# Patient Record
Sex: Male | Born: 1945 | Race: Black or African American | Hispanic: No | Marital: Married | Smoking: Current every day smoker
Health system: Southern US, Community
[De-identification: ages and names within clinical notes are randomized; demographics above are authoritative.]

## PROBLEM LIST (undated history)

## (undated) HISTORY — PX: OTHER SURGICAL HISTORY: SHX169

## (undated) HISTORY — PX: HERNIA REPAIR: SHX51

---

## 1998-10-24 ENCOUNTER — Emergency Department (HOSPITAL_COMMUNITY): Admission: EM | Admit: 1998-10-24 | Discharge: 1998-10-24 | Payer: Self-pay | Admitting: Emergency Medicine

## 1999-08-29 ENCOUNTER — Emergency Department (HOSPITAL_COMMUNITY): Admission: EM | Admit: 1999-08-29 | Discharge: 1999-08-29 | Payer: Self-pay | Admitting: Emergency Medicine

## 1999-09-05 ENCOUNTER — Emergency Department (HOSPITAL_COMMUNITY): Admission: EM | Admit: 1999-09-05 | Discharge: 1999-09-05 | Payer: Self-pay | Admitting: Emergency Medicine

## 2000-07-31 ENCOUNTER — Encounter: Payer: Self-pay | Admitting: Cardiology

## 2003-03-22 ENCOUNTER — Encounter: Admission: RE | Admit: 2003-03-22 | Discharge: 2003-03-22 | Payer: Self-pay | Admitting: Internal Medicine

## 2003-07-03 ENCOUNTER — Ambulatory Visit (HOSPITAL_COMMUNITY): Admission: RE | Admit: 2003-07-03 | Discharge: 2003-07-03 | Payer: Self-pay | Admitting: Gastroenterology

## 2004-03-29 ENCOUNTER — Ambulatory Visit (HOSPITAL_COMMUNITY): Admission: RE | Admit: 2004-03-29 | Discharge: 2004-03-29 | Payer: Self-pay | Admitting: General Surgery

## 2007-03-13 ENCOUNTER — Emergency Department (HOSPITAL_COMMUNITY): Admission: EM | Admit: 2007-03-13 | Discharge: 2007-03-13 | Payer: Self-pay | Admitting: Emergency Medicine

## 2007-11-30 ENCOUNTER — Encounter: Admission: RE | Admit: 2007-11-30 | Discharge: 2007-11-30 | Payer: Self-pay | Admitting: Cardiology

## 2010-09-06 NOTE — Op Note (Signed)
NAME:  Eddie Yang, Eddie Yang                 ACCOUNT NO.:  [id number]   MEDICAL RECORD NO.:  [id number]          PATIENT TYPE:  AMB   LOCATION:  DAY                          FACILITY:  [facility] ([facility])   PHYSICIAN:  Adolph Pollack, M.D.DATE OF BIRTH:  July 05, 1945   DATE OF PROCEDURE:  03/29/2004  DATE OF DISCHARGE:                                 OPERATIVE REPORT   PREOPERATIVE DIAGNOSIS:  Right inguinal hernia.   POSTOPERATIVE DIAGNOSIS:  Direct right inguinal hernia.   PROCEDURE:  Right inguinal hernia repair with mesh.   SURGEON:  Adolph Pollack, M.D.   ANESTHESIA:  General plus regional (0.5% plain Marcaine).   INDICATIONS:  This 65 year old male has noticed a swelling in the right  groin area.  On examination, he has an obvious right inguinal hernia and now  presents for repair.  The procedure and risks were discussed with him  preoperatively.   TECHNIQUE:  He is seen in the holding area, and the right groin marked with  my initials.  He is then brought to the operating room and placed supine on  the operating room table.  A general anesthetic was administered.  Hair from  the right groin was clipped, and the area was sterilely prepped and draped.  Local anesthetic was infiltrated in the superficial and deep tissues of the  right groin, and an incision was made through the skin, subcutaneous  tissues, Scarpa's fascia.  The external oblique aponeurosis was identified,  and local anesthetic infiltrated deep to it.  An incision was made in the  external oblique aponeurosis through the external ring medially and up  towards the anterior superior iliac spine laterally.  Using blunt  dissection, the internal oblique muscle and aponeurosis were exposed  superiorly, and the shelving edge of the inguinal ligament was exposed  inferiorly.  The ilioinguinal nerve was identified and dissected free from  the spermatic cord and anesthetized with the Marcaine solution.  The  spermatic cord was then  isolated.  The cord was inspected, and I did not see  an indirect sac present.  There was a direct hernia present.  I cleaned off  the posterior aspect of the spermatic cord and exposed the pubic tubercle  and the area medial to it.   Following this, a piece of 3 x 6 inch polypropylene mesh was brought into  the field and anchored 1 cm medial to the pubic tubercle with a 2-0 Prolene  suture.  The inferior aspect of the mesh was anchored to the shelving edge  of the inguinal ligament.  The running 2-0 Prolene suture up to this level 1  cm lateral to the internal ring.  Following this, a slit was cut in the mesh  and wrapped around the spermatic cord.  The superior aspect of the mesh was  then anchored to the internal oblique muscle and aponeurosis with  interrupted 2-0 Vicryl sutures.  The two tails of the mesh was then crossed,  creating a new internal ring, and these tails were then anchored to the  shelving edge of the inguinal ligament with a  single 2-0 Prolene suture.  The cord was mobile through the new aperture, and the tip of the hemostat  could be fit through it as well.   Following this, hemostasis was noted to be adequate.  I then closed the  external oblique aponeurosis over the mesh and then the cord with a running  3-0 Vicryl suture.  Scarpa's fascia is closed with a running 2-0 Vicryl  suture.  The skin is closed with a 4-0 Monocryl subcuticular stitch followed  by Steri-Strips and sterile dressing.   He tolerated the procedure well without any apparent complications and was  taken to the recovery room in satisfactory condition.     Tish Men  D:  03/29/2004  T:  03/29/2004  Job:  [id number]   cc:   Olene Craven, M.D.  [address]  [address]  [place]  [place] [address]  Fax: [phone]

## 2010-09-06 NOTE — Op Note (Signed)
NAME:  Eddie Yang, Eddie Yang                           ACCOUNT NO.:  [id number]   MEDICAL RECORD NO.:  [id number]                   PATIENT TYPE:  AMB   LOCATION:  ENDO                                 FACILITY:  [facility]   PHYSICIAN:  Anselmo Rod, M.D.               DATE OF BIRTH:  [birth year]   DATE OF PROCEDURE:  07/03/2003  DATE OF DISCHARGE:                                 OPERATIVE REPORT   PROCEDURE:  Screening colonoscopy.   ENDOSCOPIST:  Charna Elizabeth, M.D.   INSTRUMENT USED:  Olympus video colonoscope.   INDICATIONS FOR PROCEDURE:  65 year old African American male undergoing  screening colonoscopy to rule out colonic polyps, masses, etc.   PREPROCEDURE PREPARATION:  Informed consent was obtained from the patient.  The patient was fasted for eight hours prior to the procedure and prepped  with a bottle of magnesium citrate and a gallon of GoLYTELY the night prior  to the procedure.   PREPROCEDURE PHYSICAL:  Patient with stable vital signs.  Neck supple.  Chest clear to auscultation.  S1 and S2 regular.  Abdomen soft with normal  bowel sounds.   DESCRIPTION OF PROCEDURE:  The patient was placed in the left lateral  decubitus position, sedated with 75 mg of Demerol and 7.5 mg Versed  intravenously.  Once the patient was adequately sedated, maintained on low  flow oxygen, continuous cardiac monitoring, the Olympus video colonoscope  was advanced from the rectum to the cecum without difficulty.  The  appendiceal orifice and ileocecal valve were visualized and photographed.  No masses, polyps, erosions, ulcerations, or diverticula were seen.  Retroflexion of the rectum revealed no abnormalities.   IMPRESSION:  Normal colonoscopy to the cecum.   RECOMMENDATIONS:  1. Repeat CRC screening is recommended in the next ten years unless the     patient develops any abnormal symptoms in the interim.  2. Outpatient follow up as need arises in the future.                  Anselmo Rod, M.D.    JNM/MEDQ  D:  07/03/2003  T:  07/03/2003  Job:  [id number]   cc:   Olene Craven, M.D.  [address]  [address]  [place]  [place] [address]  Fax: [phone]

## 2012-06-17 ENCOUNTER — Ambulatory Visit: Payer: No Typology Code available for payment source | Admitting: Family Medicine

## 2012-06-17 VITALS — BP 128/82 | HR 69 | Temp 97.8°F | Resp 16 | Ht 69.0 in | Wt 165.4 lb

## 2012-06-17 DIAGNOSIS — J019 Acute sinusitis, unspecified: Secondary | ICD-10-CM

## 2012-06-17 DIAGNOSIS — R590 Localized enlarged lymph nodes: Secondary | ICD-10-CM

## 2012-06-17 DIAGNOSIS — R599 Enlarged lymph nodes, unspecified: Secondary | ICD-10-CM

## 2012-06-17 LAB — POCT CBC
Granulocyte percent: 62.6 % (ref 37–80)
HCT, POC: 45.6 % (ref 43.5–53.7)
Hemoglobin: 14.3 g/dL (ref 14.1–18.1)
Lymph, poc: 2.2 (ref 0.6–3.4)
MCH, POC: 26 pg — AB (ref 27–31.2)
MCHC: 31.4 g/dL — AB (ref 31.8–35.4)
MCV: 82.8 fL (ref 80–97)
MID (cbc): 0.5 (ref 0–0.9)
MPV: 9 fL (ref 0–99.8)
POC Granulocyte: 4.4 (ref 2–6.9)
POC LYMPH PERCENT: 30.8 % (ref 10–50)
POC MID %: 6.6 %M (ref 0–12)
Platelet Count, POC: 382 10*3/uL (ref 142–424)
RBC: 5.51 M/uL (ref 4.69–6.13)
RDW, POC: 14.3 %
WBC: 7.1 10*3/uL (ref 4.6–10.2)

## 2012-06-17 MED ORDER — AMOXICILLIN 875 MG PO TABS: 875.0000 mg | ORAL_TABLET | Freq: Two times a day (BID) | ORAL | Status: AC

## 2012-06-17 NOTE — Progress Notes (Signed)
[facility]:  Office Visit  Chief Complaint:  Chief Complaint  Patient presents with  . lump on right side of neck    HPI: Eddie Yang is a 67 y.o. male who complains of  1 week history of right side soreness, throat. Tender with touching. + chills, denies fevers. Wears dentures but recently theres is a soreness, Denies any unintentional night sweat, weight loss. Denies URI sxs. Took Alkaselzter plus and relieved his HA. No recent travels, medicines, sick contacts  History reviewed. No pertinent past medical history. Past Surgical History  Procedure Laterality Date  . Hernia repair    . Right rotator cuff     History   Social History  . Marital Status: Married    Spouse Name: N/A    Number of Children: N/A  . Years of Education: N/A   Social History Main Topics  . Smoking status: Current Every Day Smoker -- 1.00 packs/day for 40 years  . Smokeless tobacco: None  . Alcohol Use: No  . Drug Use: No  . Sexually Active: Yes   Other Topics Concern  . None   Social History Narrative  . None   Family History  Problem Relation Age of Onset  . Diabetes Father    No Known Allergies Prior to Admission medications   Not on File     ROS: The patient denies fevers, night sweats, unintentional weight loss, chest pain, palpitations, wheezing, dyspnea on exertion, nausea, vomiting, abdominal pain, dysuria, hematuria, melena, numbness, weakness, or tingling.   All other systems have been reviewed and were otherwise negative with the exception of those mentioned in the HPI and as above.    PHYSICAL EXAM: Filed Vitals:   06/17/12 1151  BP: 128/82  Pulse: 69  Temp: 97.8 F (36.6 C)  Resp: 16   Filed Vitals:   06/17/12 1151  Height: 5\' 9"  (1.753 m)  Weight: 165 lb 6.4 oz (75.025 kg)   Body mass index is 24.41 kg/(m^2).  General: Alert, no acute distress HEENT:  Normocephalic, atraumatic, oropharynx patent. TM nl. No exudates, no erythema. No  original teeth, no signs of infection in oral cavity, + sinus tenderness Cardiovascular:  Regular rate and rhythm, no rubs murmurs or gallops.  No Carotid bruits, radial pulse intact. No pedal edema.  Respiratory: Clear to auscultation bilaterally.  No wheezes, rales, or rhonchi.  No cyanosis, no use of accessory musculature GI: No organomegaly, abdomen is soft and non-tender, positive bowel sounds.  No masses. Skin: No rashes. Neurologic: Facial musculature symmetric. Psychiatric: Patient is appropriate throughout our interaction. Lymphatic: + 10 mm right anterior cervical lymphadenopathy, tender, mobile Musculoskeletal: Gait intact.   LABS: Results for orders placed in visit on 06/17/12  POCT CBC      Result Value Range   WBC 7.1  4.6 - 10.2 K/uL   Lymph, poc 2.2  0.6 - 3.4   POC LYMPH PERCENT 30.8  10 - 50 %L   MID (cbc) 0.5  0 - 0.9   POC MID % 6.6  0 - 12 %M   POC Granulocyte 4.4  2 - 6.9   Granulocyte percent 62.6  37 - 80 %G   RBC 5.51  4.69 - 6.13 M/uL   Hemoglobin 14.3  14.1 - 18.1 g/dL   HCT, POC 11.9  14.7 - 53.7 %   MCV 82.8  80 - 97 fL   MCH, POC 26.0 (*) 27 - 31.2 pg   MCHC 31.4 (*)  31.8 - 35.4 g/dL   RDW, POC 16.1     Platelet Count, POC 382  142 - 424 K/uL   MPV 9.0  0 - 99.8 fL     EKG/XRAY:   Primary read interpreted by Dr. Conley Rolls at [facility].   ASSESSMENT/PLAN: Encounter Diagnoses  Name Primary?  . Lymphadenopathy of right cervical region Yes  . Acute sinusitis     Rx Amox 875 mg BID  F/u in 3 weeks prn if LAD is not resolved    LE, THAO PHUONG, DO 06/19/2012 9:58 AM

## 2013-12-08 ENCOUNTER — Ambulatory Visit
Admission: RE | Admit: 2013-12-08 | Discharge: 2013-12-08 | Disposition: A | Discharge status: Home / Self Care | Payer: PRIVATE HEALTH INSURANCE | Source: Ambulatory Visit | Attending: Family Medicine | Admitting: Family Medicine

## 2013-12-08 ENCOUNTER — Other Ambulatory Visit: Payer: Self-pay | Admitting: Family Medicine

## 2013-12-08 DIAGNOSIS — R200 Anesthesia of skin: Secondary | ICD-10-CM

## 2013-12-08 DIAGNOSIS — R202 Paresthesia of skin: Secondary | ICD-10-CM

## 2013-12-08 DIAGNOSIS — F17200 Nicotine dependence, unspecified, uncomplicated: Secondary | ICD-10-CM

## 2013-12-20 ENCOUNTER — Other Ambulatory Visit: Payer: Self-pay | Admitting: Family Medicine

## 2013-12-20 DIAGNOSIS — R9389 Abnormal findings on diagnostic imaging of other specified body structures: Secondary | ICD-10-CM

## 2013-12-22 ENCOUNTER — Ambulatory Visit
Admission: RE | Admit: 2013-12-22 | Discharge: 2013-12-22 | Disposition: A | Discharge status: Home / Self Care | Payer: PRIVATE HEALTH INSURANCE | Source: Ambulatory Visit | Attending: Family Medicine | Admitting: Family Medicine

## 2013-12-22 DIAGNOSIS — R9389 Abnormal findings on diagnostic imaging of other specified body structures: Secondary | ICD-10-CM

## 2013-12-22 MED ORDER — IOHEXOL 300 MG/ML  SOLN
75.0000 mL | Freq: Once | INTRAMUSCULAR | Status: AC | PRN
  Administered 2013-12-22: 75 mL via INTRAVENOUS

## 2018-01-26 ENCOUNTER — Telehealth: Payer: Self-pay | Admitting: Acute Care

## 2018-01-26 DIAGNOSIS — Z122 Encounter for screening for malignant neoplasm of respiratory organs: Secondary | ICD-10-CM

## 2018-01-26 DIAGNOSIS — F17210 Nicotine dependence, cigarettes, uncomplicated: Principal | ICD-10-CM

## 2018-01-29 NOTE — Telephone Encounter (Signed)
Routing message to denise.

## 2018-02-02 NOTE — Telephone Encounter (Signed)
Spoke with pt and scheduled SDMV 02/10/18 9:30 CT ordered Nothing further needed

## 2018-02-10 ENCOUNTER — Encounter: Payer: Self-pay | Admitting: Acute Care

## 2018-02-10 ENCOUNTER — Ambulatory Visit (INDEPENDENT_AMBULATORY_CARE_PROVIDER_SITE_OTHER)
Admission: RE | Admit: 2018-02-10 | Discharge: 2018-02-10 | Disposition: A | Discharge status: Home / Self Care | Payer: No Typology Code available for payment source | Source: Ambulatory Visit | Attending: Acute Care | Admitting: Acute Care

## 2018-02-10 ENCOUNTER — Ambulatory Visit (INDEPENDENT_AMBULATORY_CARE_PROVIDER_SITE_OTHER): Payer: No Typology Code available for payment source | Admitting: Acute Care

## 2018-02-10 DIAGNOSIS — F17210 Nicotine dependence, cigarettes, uncomplicated: Secondary | ICD-10-CM

## 2018-02-10 DIAGNOSIS — Z122 Encounter for screening for malignant neoplasm of respiratory organs: Secondary | ICD-10-CM

## 2018-02-10 NOTE — Progress Notes (Signed)
Shared Decision Making Visit Lung Cancer Screening Program [phone])   Eligibility:  Age 72 y.o.  Pack Years Smoking History Calculation 40 pack year smoking history (# packs/per year x # years smoked)  Recent History of coughing up blood  no  Unexplained weight loss? no ( >Than 15 pounds within the last 6 months )  Prior History Lung / other cancer no (Diagnosis within the last 5 years already requiring surveillance chest CT Scans).  Smoking Status Current Smoker  Former Smokers: Years since quit: NA  Quit Date: NA  Visit Components:  Discussion included one or more decision making aids. yes  Discussion included risk/benefits of screening. yes  Discussion included potential follow up diagnostic testing for abnormal scans. yes  Discussion included meaning and risk of over diagnosis. yes  Discussion included meaning and risk of False Positives. yes  Discussion included meaning of total radiation exposure. yes  Counseling Included:  Importance of adherence to annual lung cancer LDCT screening. yes  Impact of comorbidities on ability to participate in the program. yes  Ability and willingness to under diagnostic treatment. yes  Smoking Cessation Counseling:  Current Smokers:   Discussed importance of smoking cessation. yes  Information about tobacco cessation classes and interventions provided to patient. yes  Patient provided with "ticket" for LDCT Scan. yes  Symptomatic Patient. no  Counseling  Diagnosis Code: Tobacco Use Z72.0  Asymptomatic Patient yes  Counseling (Intermediate counseling: > three minutes counseling) L2440  Former Smokers:   Discussed the importance of maintaining cigarette abstinence. yes  Diagnosis Code: Personal History of Nicotine Dependence. N02.725  Information about tobacco cessation classes and interventions provided to patient. Yes  Patient provided with "ticket" for LDCT Scan. yes  Written Order for Lung Cancer  Screening with LDCT placed in Epic. Yes (CT Chest Lung Cancer Screening Low Dose W/O CM) [id number] Z12.2-Screening of respiratory organs Z87.891-Personal history of nicotine dependence  I have spent 25 minutes of face to face time with Mr. Nack discussing the risks and benefits of lung cancer screening. We viewed a power point together that explained in detail the above noted topics. We paused at intervals to allow for questions to be asked and answered to ensure understanding.We discussed that the single most powerful action that he can take to decrease his risk of developing lung cancer is to quit smoking. We discussed whether or not he is ready to commit to setting a quit date. He is not ready to set a quit date.We discussed options for tools to aid in quitting smoking including nicotine replacement therapy, non-nicotine medications, support groups, Quit Smart classes, and behavior modification. We discussed that often times setting smaller, more achievable goals, such as eliminating 1 cigarette a day for a week and then 2 cigarettes a day for a week can be helpful in slowly decreasing the number of cigarettes smoked. This allows for a sense of accomplishment as well as providing a clinical benefit. I gave him the " Be Stronger Than Your Excuses" card with contact information for community resources, classes, free nicotine replacement therapy, and access to mobile apps, text messaging, and on-line smoking cessation help. I have also given him my card and contact information in the event he needs to contact me. We discussed the time and location of the scan, and that either Abigail Miyamoto RN or I will call with the results within 24-48 hours of receiving them. I have offered him  a copy of the power point we viewed  as a  resource in the event they need reinforcement of the concepts we discussed today in the office. The patient verbalized understanding of all of  the above and had no further questions upon  leaving the office. They have my contact information in the event they have any further questions.  I spent 3 minutes counseling on smoking cessation and the health risks of continued tobacco abuse.  I explained to the patient that there has been a high incidence of coronary artery disease noted on these exams. I explained that this is a non-gated exam therefore degree or severity cannot be determined. This patient is currently on statin therapy. I have asked the patient to follow-up with their PCP regarding any incidental finding of coronary artery disease and management with diet or medication as their PCP  feels is clinically indicated. The patient verbalized understanding of the above and had no further questions upon completion of the visit.      Bevelyn Ngo, NP 02/10/2018 9:55 AM

## 2018-02-11 ENCOUNTER — Other Ambulatory Visit: Payer: Self-pay | Admitting: Acute Care

## 2018-02-11 DIAGNOSIS — F17210 Nicotine dependence, cigarettes, uncomplicated: Principal | ICD-10-CM

## 2018-02-11 DIAGNOSIS — Z87891 Personal history of nicotine dependence: Secondary | ICD-10-CM

## 2018-02-11 DIAGNOSIS — Z122 Encounter for screening for malignant neoplasm of respiratory organs: Secondary | ICD-10-CM

## 2018-09-29 ENCOUNTER — Other Ambulatory Visit: Payer: Self-pay | Admitting: Pediatric Intensive Care

## 2018-09-29 DIAGNOSIS — Z20822 Contact with and (suspected) exposure to covid-19: Secondary | ICD-10-CM

## 2018-10-01 LAB — NOVEL CORONAVIRUS, NAA: SARS-CoV-2, NAA: NOT DETECTED

## 2019-03-01 ENCOUNTER — Other Ambulatory Visit: Payer: Self-pay

## 2019-03-01 ENCOUNTER — Ambulatory Visit (INDEPENDENT_AMBULATORY_CARE_PROVIDER_SITE_OTHER)
Admission: RE | Admit: 2019-03-01 | Discharge: 2019-03-01 | Disposition: A | Discharge status: Home / Self Care | Payer: No Typology Code available for payment source | Source: Ambulatory Visit | Attending: Acute Care | Admitting: Acute Care

## 2019-03-01 ENCOUNTER — Encounter (INDEPENDENT_AMBULATORY_CARE_PROVIDER_SITE_OTHER): Payer: Self-pay

## 2019-03-01 DIAGNOSIS — Z87891 Personal history of nicotine dependence: Secondary | ICD-10-CM | POA: Diagnosis not present

## 2019-03-01 DIAGNOSIS — Z122 Encounter for screening for malignant neoplasm of respiratory organs: Secondary | ICD-10-CM

## 2019-03-01 DIAGNOSIS — F17210 Nicotine dependence, cigarettes, uncomplicated: Secondary | ICD-10-CM

## 2019-03-09 ENCOUNTER — Telehealth: Payer: Self-pay | Admitting: Acute Care

## 2019-03-09 DIAGNOSIS — Z122 Encounter for screening for malignant neoplasm of respiratory organs: Secondary | ICD-10-CM

## 2019-03-09 DIAGNOSIS — Z87891 Personal history of nicotine dependence: Secondary | ICD-10-CM

## 2019-03-09 DIAGNOSIS — F17210 Nicotine dependence, cigarettes, uncomplicated: Secondary | ICD-10-CM

## 2019-03-09 NOTE — Telephone Encounter (Signed)
Pt informed of CT results per Sarah Groce, NP.  PT verbalized understanding.  Copy sent to PCP.  Order placed for 1 yr f/u CT.  

## 2021-05-29 IMAGING — CT CT CHEST LUNG CANCER SCREENING LOW DOSE W/O CM
2 of 3 series · 15 of 36 positions shown, 18 images · non-contrast
Comparison: 02/10/2018

CLINICAL DATA: Lung cancer screening. Annual follow-up. Thirty-two
pack-year history. Current asymptomatic smoker

EXAM:
CT CHEST WITHOUT CONTRAST LOW-DOSE FOR LUNG CANCER SCREENING
TECHNIQUE: Multidetector CT imaging of the chest was performed following the
standard protocol without IV contrast.

[Series 2: thorax 5.0 i31f 3 · axial · 0.70mm/px · z∈[-390,-70]mm · 12 of 76 slices shown, 15 images]
[im 6/76  mediastinal]
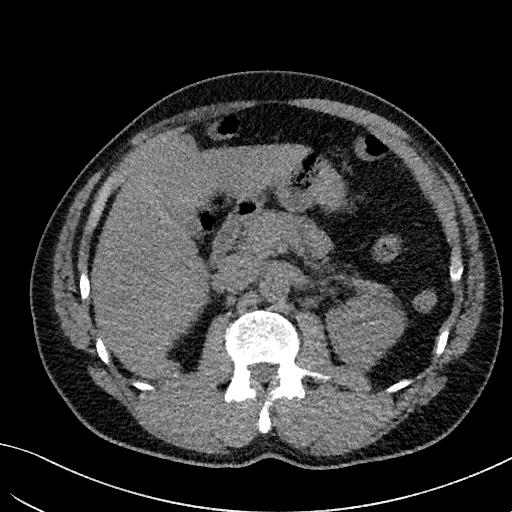
[im 6/76  lung]
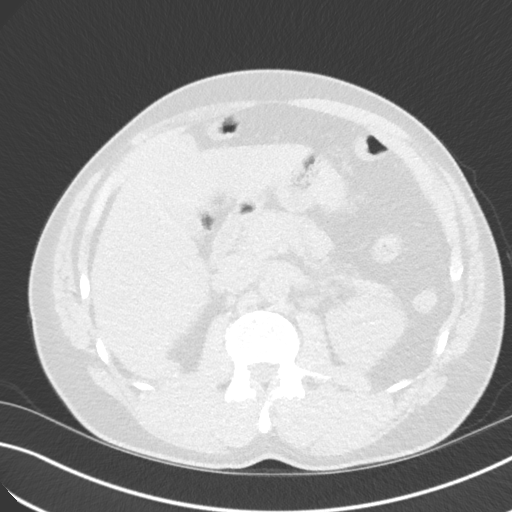
[im 12/76  lung]
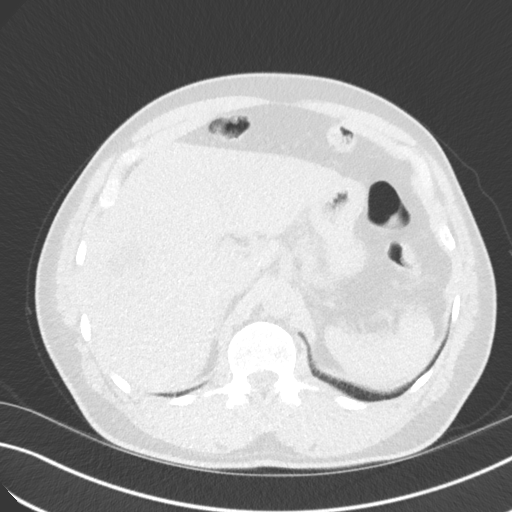
[im 17/76  lung]
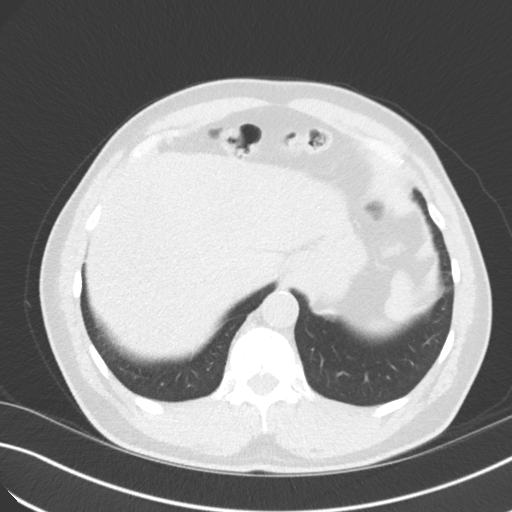
[im 23/76  lung]
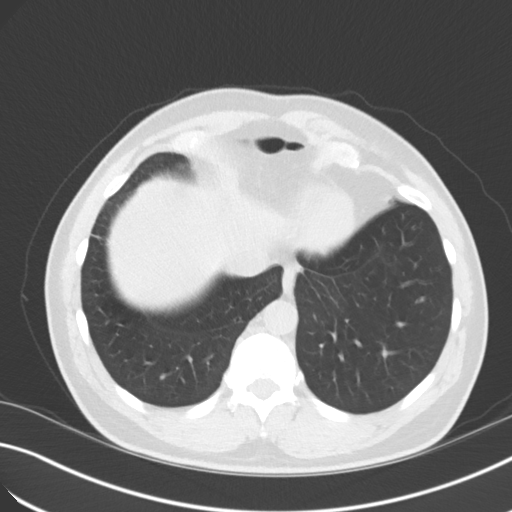
[im 28/76  mediastinal]
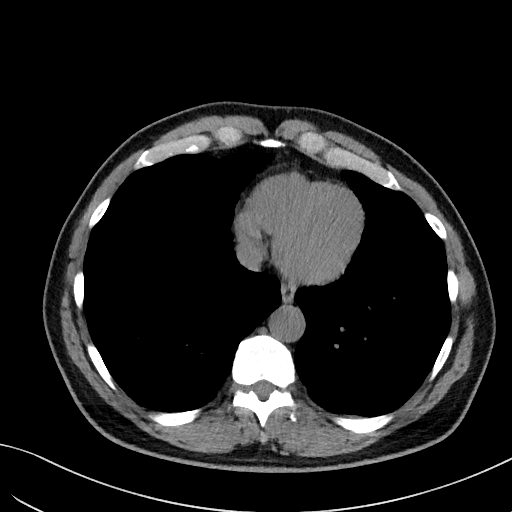
[im 28/76  lung]
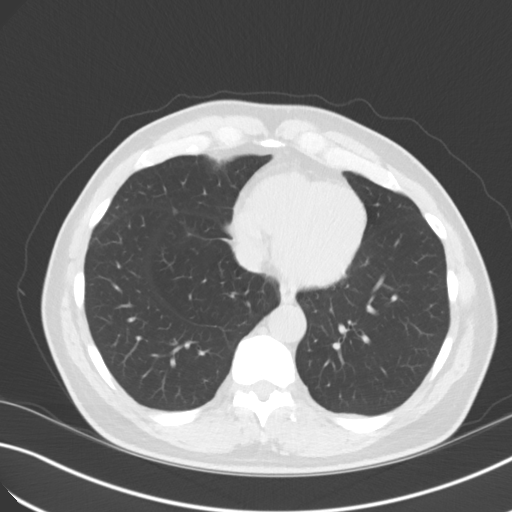
[im 34/76  lung]
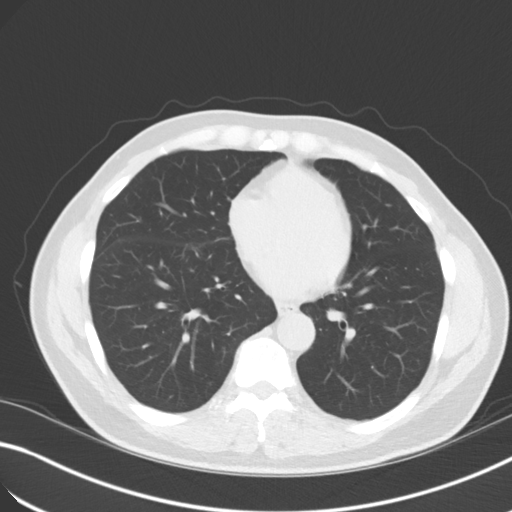
[im 42/76  lung]
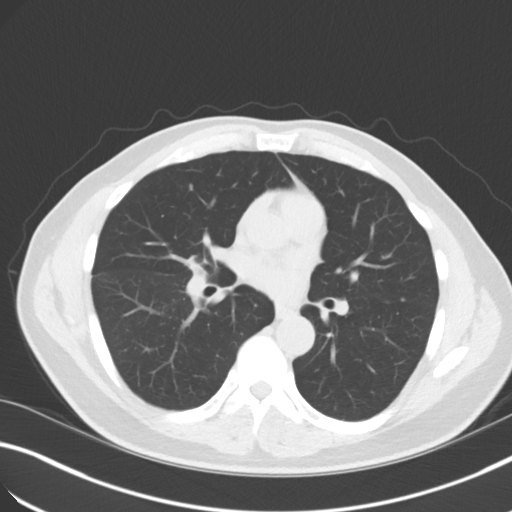
[im 48/76  lung]
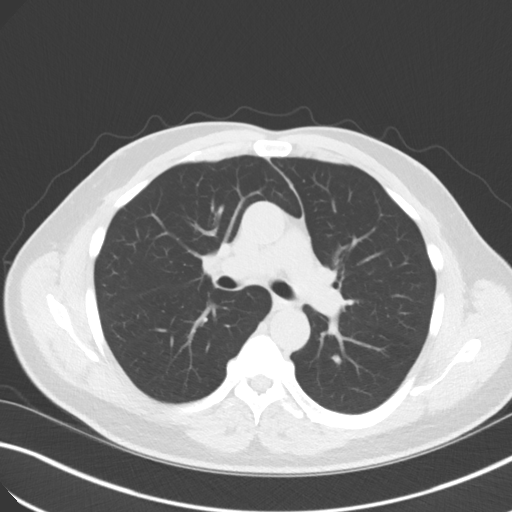
[im 53/76  mediastinal]
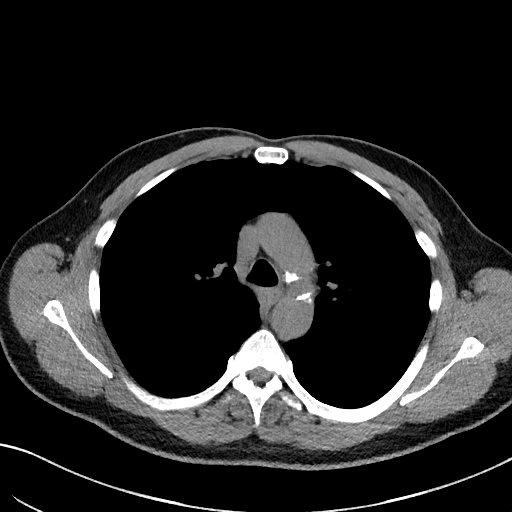
[im 53/76  lung]
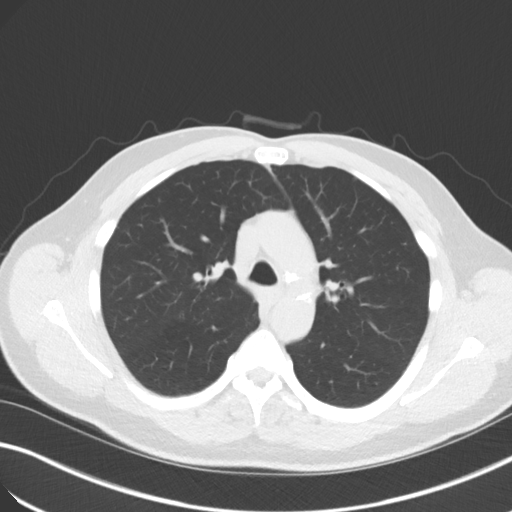
[im 59/76  lung]
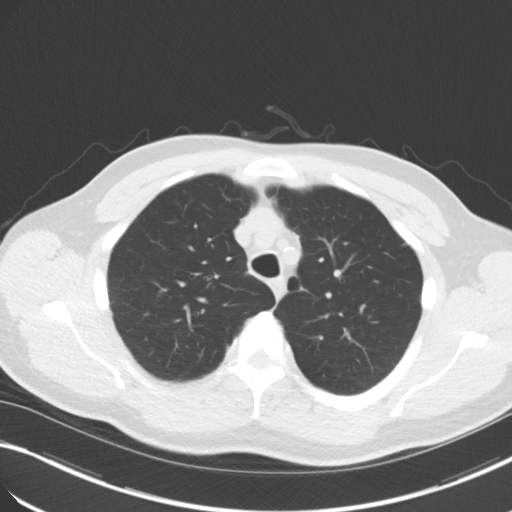
[im 64/76  lung]
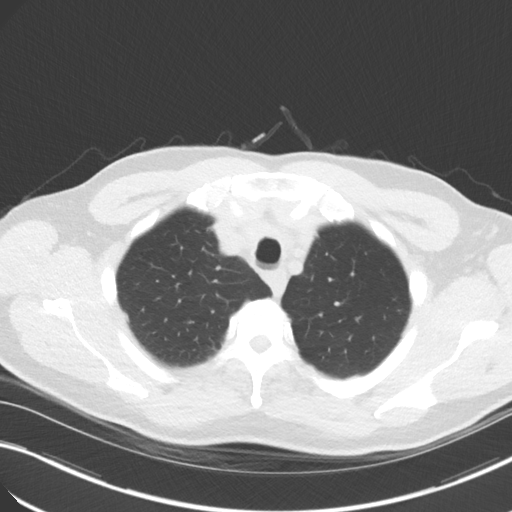
[im 70/76  lung]
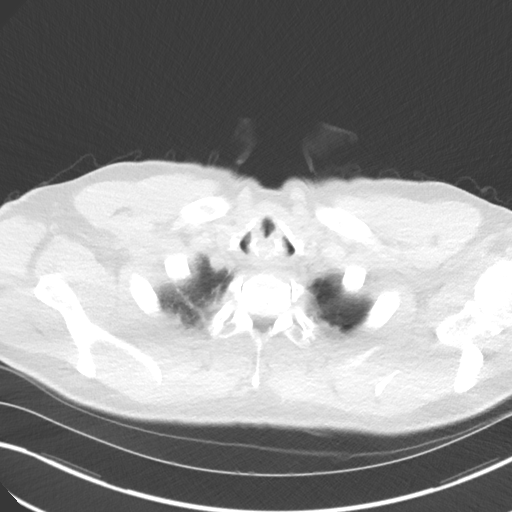

[Series 5: coronal · coronal · 0.60mm/px · 3 of 129 slices shown]
[im 26/129  lung]
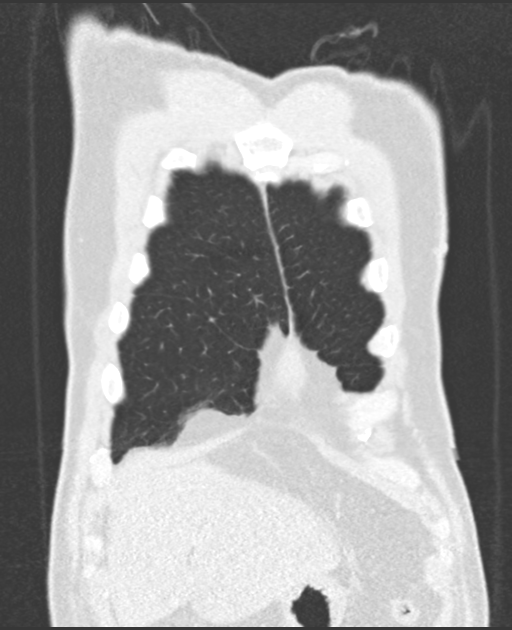
[im 52/129  lung]
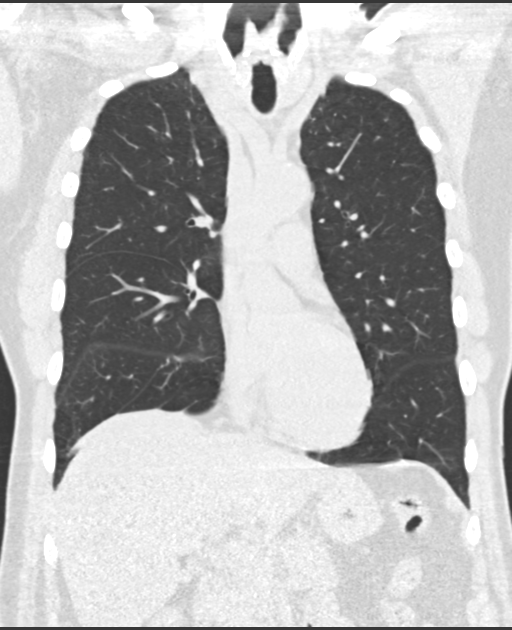
[im 77/129  lung]
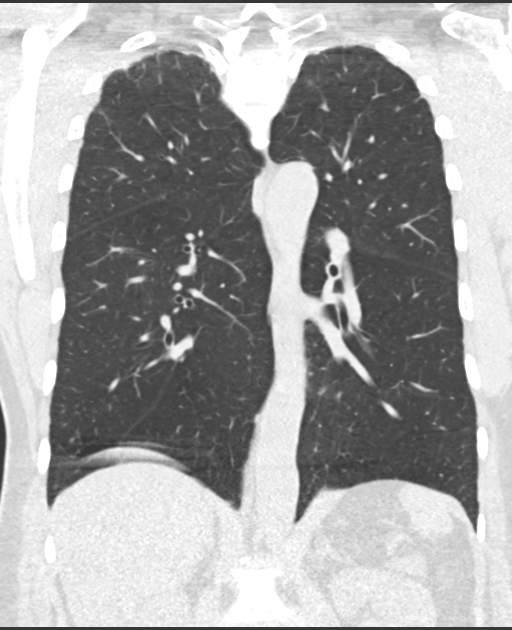

[15 of 36 positions shown; findings below may reference images not displayed]

FINDINGS: Cardiovascular: Normal heart size. Aortic atherosclerosis. No
pericardial effusion.

Mediastinum/Nodes: No enlarged mediastinal, hilar, or axillary lymph
nodes. Thyroid gland, trachea, and esophagus demonstrate no
significant findings.

Lungs/Pleura: Moderate changes of emphysema. No pleural effusion,
airspace consolidation or atelectasis. Biapical pleural and
parenchymal scarring. There are multiple small pulmonary nodules
identified in both lungs. The largest nodule is in the posterior
left apex with an equivalent diameter of 4.4 mm. These are unchanged
when compared with the previous exam. No enlarging or new suspicious
nodules identified.

Upper Abdomen: Unchanged 2.6 cm low-density structure in right lobe
of liver. No acute findings within the upper abdomen.

Musculoskeletal: No chest wall mass or suspicious bone lesions
identified.
IMPRESSION: 1. Lung-RADS 2, benign appearance or behavior. Continue annual
screening with low-dose chest CT without contrast in 12 months.

Aortic Atherosclerosis ([id number]) and Emphysema ([id number].B).

## 2023-03-02 ENCOUNTER — Other Ambulatory Visit: Payer: Self-pay | Admitting: Physician Assistant

## 2023-03-02 DIAGNOSIS — Z87891 Personal history of nicotine dependence: Secondary | ICD-10-CM

## 2023-03-02 DIAGNOSIS — F17210 Nicotine dependence, cigarettes, uncomplicated: Secondary | ICD-10-CM

## 2023-03-13 ENCOUNTER — Ambulatory Visit
Admission: RE | Admit: 2023-03-13 | Discharge: 2023-03-13 | Disposition: A | Discharge status: Home / Self Care | Payer: No Typology Code available for payment source | Source: Ambulatory Visit | Attending: Physician Assistant

## 2023-03-13 DIAGNOSIS — Z87891 Personal history of nicotine dependence: Secondary | ICD-10-CM

## 2023-03-13 DIAGNOSIS — F17210 Nicotine dependence, cigarettes, uncomplicated: Secondary | ICD-10-CM

## 2024-03-14 ENCOUNTER — Other Ambulatory Visit: Payer: Self-pay | Admitting: Physician Assistant

## 2024-03-14 DIAGNOSIS — Z122 Encounter for screening for malignant neoplasm of respiratory organs: Secondary | ICD-10-CM
